# Patient Record
Sex: Female | Born: 1960 | Race: White | Hispanic: No | Marital: Married | Smoking: Never smoker
Health system: Southern US, Community
[De-identification: ages and names within clinical notes are randomized; demographics above are authoritative.]

## PROBLEM LIST (undated history)

## (undated) DIAGNOSIS — G5600 Carpal tunnel syndrome, unspecified upper limb: Secondary | ICD-10-CM

## (undated) HISTORY — PX: WISDOM TOOTH EXTRACTION: SHX21

---

## 1998-04-06 ENCOUNTER — Other Ambulatory Visit: Admission: RE | Admit: 1998-04-06 | Discharge: 1998-04-06 | Payer: Self-pay | Admitting: *Deleted

## 1998-11-06 ENCOUNTER — Other Ambulatory Visit: Admission: RE | Admit: 1998-11-06 | Discharge: 1998-11-06 | Payer: Self-pay | Admitting: Obstetrics & Gynecology

## 1999-01-02 ENCOUNTER — Encounter: Payer: Self-pay | Admitting: *Deleted

## 1999-01-02 ENCOUNTER — Ambulatory Visit (HOSPITAL_COMMUNITY): Admission: RE | Admit: 1999-01-02 | Discharge: 1999-01-02 | Payer: Self-pay | Admitting: *Deleted

## 1999-05-06 ENCOUNTER — Inpatient Hospital Stay (HOSPITAL_COMMUNITY): Admission: AD | Admit: 1999-05-06 | Discharge: 1999-05-06 | Payer: Self-pay | Admitting: *Deleted

## 1999-05-18 ENCOUNTER — Encounter (INDEPENDENT_AMBULATORY_CARE_PROVIDER_SITE_OTHER): Payer: Self-pay | Admitting: Specialist

## 1999-05-18 ENCOUNTER — Inpatient Hospital Stay (HOSPITAL_COMMUNITY): Admission: AD | Admit: 1999-05-18 | Discharge: 1999-05-20 | Payer: Self-pay | Admitting: *Deleted

## 2000-10-28 ENCOUNTER — Other Ambulatory Visit: Admission: RE | Admit: 2000-10-28 | Discharge: 2000-10-28 | Payer: Self-pay | Admitting: Obstetrics and Gynecology

## 2002-05-20 ENCOUNTER — Other Ambulatory Visit: Admission: RE | Admit: 2002-05-20 | Discharge: 2002-05-20 | Payer: Self-pay | Admitting: Obstetrics and Gynecology

## 2003-08-24 ENCOUNTER — Other Ambulatory Visit: Admission: RE | Admit: 2003-08-24 | Discharge: 2003-08-24 | Payer: Self-pay | Admitting: Obstetrics and Gynecology

## 2003-09-14 ENCOUNTER — Ambulatory Visit (HOSPITAL_COMMUNITY): Admission: RE | Admit: 2003-09-14 | Discharge: 2003-09-14 | Payer: Self-pay | Admitting: Obstetrics and Gynecology

## 2004-09-20 ENCOUNTER — Ambulatory Visit (HOSPITAL_COMMUNITY): Admission: RE | Admit: 2004-09-20 | Discharge: 2004-09-20 | Payer: Self-pay | Admitting: Obstetrics and Gynecology

## 2004-10-02 ENCOUNTER — Encounter: Admission: RE | Admit: 2004-10-02 | Discharge: 2004-10-02 | Payer: Self-pay | Admitting: Obstetrics and Gynecology

## 2004-11-12 ENCOUNTER — Other Ambulatory Visit: Admission: RE | Admit: 2004-11-12 | Discharge: 2004-11-12 | Payer: Self-pay | Admitting: Obstetrics and Gynecology

## 2005-10-23 ENCOUNTER — Encounter: Admission: RE | Admit: 2005-10-23 | Discharge: 2005-10-23 | Payer: Self-pay | Admitting: Obstetrics and Gynecology

## 2005-12-15 ENCOUNTER — Other Ambulatory Visit: Admission: RE | Admit: 2005-12-15 | Discharge: 2005-12-15 | Payer: Self-pay | Admitting: Obstetrics and Gynecology

## 2006-10-29 ENCOUNTER — Encounter: Admission: RE | Admit: 2006-10-29 | Discharge: 2006-10-29 | Payer: Self-pay | Admitting: Family Medicine

## 2007-11-01 ENCOUNTER — Encounter: Admission: RE | Admit: 2007-11-01 | Payer: Self-pay | Admitting: Obstetrics and Gynecology

## 2009-04-20 ENCOUNTER — Encounter: Admission: RE | Admit: 2009-04-20 | Discharge: 2009-04-20 | Payer: Self-pay | Admitting: Obstetrics and Gynecology

## 2010-02-24 ENCOUNTER — Encounter: Payer: Self-pay | Admitting: Obstetrics and Gynecology

## 2010-06-21 NOTE — Discharge Summary (Signed)
[facility]  Patient:    Jo Sampson, Jo Sampson                     MRN: [id number] Adm. Date:  [id number] Disc. Date: [id number] Attending:  Shaune Spittle Dictator:   Vance Gather Duplantis, C.N.M.                           Discharge Summary  ADMISSION DIAGNOSES:          1. Intrauterine pregnancy at term.                               2. Active labor.  DISCHARGE DIAGNOSES:          1. Intrauterine pregnancy at term.                               2. Active labor.                               3. Retained placenta, manual removal.                               4. Breast-feeding.                               5. Plan of vasectomy for contraception.  HOSPITAL COURSE:              Jo Sampson is a 50 year old married white female, gravida 3, para 2-0-0-2, at term, who presents in active labor and progressed rapidly to NSVD of a viable female infant named [name].  Had Apgars of 8 and 9 nd weighed 6 pounds 9 ounces, over a midline episiotomy.  She had a retained placenta that required manual removal with nitroglycerin and was successfully removed manually.  Postpartum, she has done well.  She was given three doses of Ancef IV prophylactically and has been afebrile, and her bleeding has been within normal  limits, as has her cramping.  She is breast-feeding without difficulty.  She is  ambulating and voiding, also without difficulty.  She plans vasectomy for contraception.  She is deemed ready for discharge today.  DISCHARGE INSTRUCTIONS:       As per the [facility] handout.  DISCHARGE MEDICATIONS:        1. Motrin 600 mg p.o. q.6h. p.r.n. for pain.                               2. Tylox 1-2 p.o. q.4-6h. p.r.n. for pain.  LABORATORY DATA:              Discharge hemoglobin 9.9, WBC count is 16.1, and er platelets are 219.  FOLLOW-UP:                    Will be in six weeks at [facility] or p.r.n. DD:  05/20/99 TD:   05/20/99 Job: [id number]

## 2010-08-12 ENCOUNTER — Other Ambulatory Visit: Payer: Self-pay | Admitting: Obstetrics and Gynecology

## 2010-08-12 ENCOUNTER — Other Ambulatory Visit (HOSPITAL_COMMUNITY): Payer: Self-pay | Admitting: Obstetrics and Gynecology

## 2010-08-12 DIAGNOSIS — Z1231 Encounter for screening mammogram for malignant neoplasm of breast: Secondary | ICD-10-CM

## 2010-08-20 ENCOUNTER — Ambulatory Visit (HOSPITAL_COMMUNITY): Payer: Self-pay

## 2010-08-22 ENCOUNTER — Ambulatory Visit
Admission: RE | Admit: 2010-08-22 | Discharge: 2010-08-22 | Disposition: A | Discharge status: Home / Self Care | Payer: BC Managed Care – PPO | Source: Ambulatory Visit | Attending: Obstetrics and Gynecology | Admitting: Obstetrics and Gynecology

## 2010-08-22 DIAGNOSIS — Z1231 Encounter for screening mammogram for malignant neoplasm of breast: Secondary | ICD-10-CM

## 2012-04-29 ENCOUNTER — Other Ambulatory Visit: Payer: Self-pay

## 2012-04-29 DIAGNOSIS — Z1231 Encounter for screening mammogram for malignant neoplasm of breast: Secondary | ICD-10-CM

## 2012-05-06 ENCOUNTER — Ambulatory Visit
Admission: RE | Admit: 2012-05-06 | Discharge: 2012-05-06 | Disposition: A | Discharge status: Home / Self Care | Payer: PRIVATE HEALTH INSURANCE | Source: Ambulatory Visit

## 2012-05-06 DIAGNOSIS — Z1231 Encounter for screening mammogram for malignant neoplasm of breast: Secondary | ICD-10-CM

## 2013-10-25 ENCOUNTER — Other Ambulatory Visit: Payer: Self-pay

## 2013-10-25 DIAGNOSIS — Z1231 Encounter for screening mammogram for malignant neoplasm of breast: Secondary | ICD-10-CM

## 2013-11-14 ENCOUNTER — Ambulatory Visit
Admission: RE | Admit: 2013-11-14 | Discharge: 2013-11-14 | Disposition: A | Discharge status: Home / Self Care | Payer: 59 | Source: Ambulatory Visit

## 2013-11-14 DIAGNOSIS — Z1231 Encounter for screening mammogram for malignant neoplasm of breast: Secondary | ICD-10-CM

## 2014-02-28 ENCOUNTER — Other Ambulatory Visit (HOSPITAL_COMMUNITY)
Admission: RE | Admit: 2014-02-28 | Discharge: 2014-02-28 | Disposition: A | Discharge status: Home / Self Care | Payer: 59 | Source: Ambulatory Visit | Attending: Family Medicine | Admitting: Family Medicine

## 2014-02-28 ENCOUNTER — Other Ambulatory Visit: Payer: Self-pay | Admitting: Family Medicine

## 2014-02-28 DIAGNOSIS — Z01419 Encounter for gynecological examination (general) (routine) without abnormal findings: Secondary | ICD-10-CM | POA: Insufficient documentation

## 2014-03-01 LAB — CYTOLOGY - PAP

## 2014-07-06 ENCOUNTER — Other Ambulatory Visit: Payer: Self-pay | Admitting: Orthopedic Surgery

## 2014-07-17 ENCOUNTER — Encounter (HOSPITAL_BASED_OUTPATIENT_CLINIC_OR_DEPARTMENT_OTHER): Payer: Self-pay | Admitting: *Deleted

## 2014-07-18 ENCOUNTER — Ambulatory Visit (HOSPITAL_BASED_OUTPATIENT_CLINIC_OR_DEPARTMENT_OTHER): Payer: 59 | Admitting: Anesthesiology

## 2014-07-18 ENCOUNTER — Encounter (HOSPITAL_BASED_OUTPATIENT_CLINIC_OR_DEPARTMENT_OTHER)
Admission: RE | Disposition: A | Discharge status: Home / Self Care | Payer: Self-pay | Source: Ambulatory Visit | Attending: Orthopedic Surgery

## 2014-07-18 ENCOUNTER — Ambulatory Visit (HOSPITAL_BASED_OUTPATIENT_CLINIC_OR_DEPARTMENT_OTHER)
Admission: RE | Admit: 2014-07-18 | Discharge: 2014-07-18 | Disposition: A | Discharge status: Home / Self Care | Payer: 59 | Source: Ambulatory Visit | Attending: Orthopedic Surgery | Admitting: Orthopedic Surgery

## 2014-07-18 ENCOUNTER — Encounter (HOSPITAL_BASED_OUTPATIENT_CLINIC_OR_DEPARTMENT_OTHER): Payer: Self-pay | Admitting: Orthopedic Surgery

## 2014-07-18 DIAGNOSIS — G5601 Carpal tunnel syndrome, right upper limb: Secondary | ICD-10-CM | POA: Diagnosis not present

## 2014-07-18 DIAGNOSIS — Z79899 Other long term (current) drug therapy: Secondary | ICD-10-CM | POA: Diagnosis not present

## 2014-07-18 DIAGNOSIS — M79641 Pain in right hand: Secondary | ICD-10-CM | POA: Diagnosis present

## 2014-07-18 DIAGNOSIS — R200 Anesthesia of skin: Secondary | ICD-10-CM | POA: Diagnosis present

## 2014-07-18 HISTORY — PX: CARPAL TUNNEL RELEASE: SHX101

## 2014-07-18 HISTORY — DX: Carpal tunnel syndrome, unspecified upper limb: G56.00

## 2014-07-18 LAB — POCT HEMOGLOBIN-HEMACUE: HEMOGLOBIN: 13.5 g/dL (ref 12.0–15.0)

## 2014-07-18 SURGERY — CARPAL TUNNEL RELEASE
Anesthesia: Monitor Anesthesia Care | Site: Hand | Laterality: Right

## 2014-07-18 MED ORDER — CHLORHEXIDINE GLUCONATE 4 % EX LIQD: 60.0000 mL | Freq: Once | CUTANEOUS | Status: DC

## 2014-07-18 MED ORDER — MIDAZOLAM HCL 2 MG/2ML IJ SOLN
INTRAMUSCULAR | Status: AC
  Filled 2014-07-18: qty 2

## 2014-07-18 MED ORDER — CEFAZOLIN SODIUM-DEXTROSE 2-3 GM-% IV SOLR: 2.0000 g | INTRAVENOUS | Status: DC

## 2014-07-18 MED ORDER — MIDAZOLAM HCL 5 MG/5ML IJ SOLN
INTRAMUSCULAR | Status: DC | PRN
  Administered 2014-07-18 (×2): 1 mg via INTRAVENOUS

## 2014-07-18 MED ORDER — FENTANYL CITRATE (PF) 100 MCG/2ML IJ SOLN
INTRAMUSCULAR | Status: AC
Start: 2014-07-18 — End: 2014-07-18
  Filled 2014-07-18: qty 2

## 2014-07-18 MED ORDER — HYDROMORPHONE HCL 1 MG/ML IJ SOLN: 0.2500 mg | INTRAMUSCULAR | Status: DC | PRN

## 2014-07-18 MED ORDER — PROMETHAZINE HCL 25 MG/ML IJ SOLN: 6.2500 mg | INTRAMUSCULAR | Status: DC | PRN

## 2014-07-18 MED ORDER — BUPIVACAINE HCL (PF) 0.25 % IJ SOLN
INTRAMUSCULAR | Status: DC | PRN
  Administered 2014-07-18: 6 mL

## 2014-07-18 MED ORDER — PROPOFOL 500 MG/50ML IV EMUL
INTRAVENOUS | Status: AC
  Filled 2014-07-18: qty 50

## 2014-07-18 MED ORDER — CEFAZOLIN SODIUM-DEXTROSE 2-3 GM-% IV SOLR
2.0000 g | INTRAVENOUS | Status: AC
  Administered 2014-07-18: 2 g via INTRAVENOUS

## 2014-07-18 MED ORDER — FENTANYL CITRATE (PF) 100 MCG/2ML IJ SOLN
INTRAMUSCULAR | Status: DC | PRN
  Administered 2014-07-18 (×2): 50 ug via INTRAVENOUS

## 2014-07-18 MED ORDER — LACTATED RINGERS IV SOLN
INTRAVENOUS | Status: DC
  Administered 2014-07-18: 11:00:00 via INTRAVENOUS

## 2014-07-18 MED ORDER — PROPOFOL 10 MG/ML IV BOLUS
INTRAVENOUS | Status: DC | PRN
  Administered 2014-07-18 (×2): 20 mg via INTRAVENOUS

## 2014-07-18 MED ORDER — CEFAZOLIN SODIUM-DEXTROSE 2-3 GM-% IV SOLR
INTRAVENOUS | Status: AC
  Filled 2014-07-18: qty 50

## 2014-07-18 MED ORDER — HYDROCODONE-ACETAMINOPHEN 5-325 MG PO TABS: 1.0000 | ORAL_TABLET | Freq: Four times a day (QID) | ORAL | Status: DC | PRN

## 2014-07-18 MED ORDER — LIDOCAINE HCL (CARDIAC) 20 MG/ML IV SOLN
INTRAVENOUS | Status: DC | PRN
  Administered 2014-07-18: 35 mg via INTRAVENOUS

## 2014-07-18 MED ORDER — SODIUM CHLORIDE 0.9 % IR SOLN
Status: DC | PRN
  Administered 2014-07-18: 200 mL

## 2014-07-18 SURGICAL SUPPLY — 40 items
BLADE SURG 15 STRL LF DISP TIS (BLADE) ×1 IMPLANT
BLADE SURG 15 STRL SS (BLADE) ×3
BNDG CMPR 9X4 STRL LF SNTH (GAUZE/BANDAGES/DRESSINGS)
BNDG COHESIVE 3X5 TAN STRL LF (GAUZE/BANDAGES/DRESSINGS) ×3 IMPLANT
BNDG ESMARK 4X9 LF (GAUZE/BANDAGES/DRESSINGS) IMPLANT
BNDG GAUZE ELAST 4 BULKY (GAUZE/BANDAGES/DRESSINGS) ×3 IMPLANT
CHLORAPREP W/TINT 26ML (MISCELLANEOUS) ×3 IMPLANT
CORDS BIPOLAR (ELECTRODE) ×3 IMPLANT
COVER BACK TABLE 60X90IN (DRAPES) ×3 IMPLANT
COVER MAYO STAND STRL (DRAPES) ×3 IMPLANT
CUFF TOURNIQUET SINGLE 18IN (TOURNIQUET CUFF) ×3 IMPLANT
DRAPE EXTREMITY T 121X128X90 (DRAPE) ×3 IMPLANT
DRAPE SURG 17X23 STRL (DRAPES) ×3 IMPLANT
DRSG PAD ABDOMINAL 8X10 ST (GAUZE/BANDAGES/DRESSINGS) ×3 IMPLANT
GAUZE SPONGE 4X4 12PLY STRL (GAUZE/BANDAGES/DRESSINGS) ×3 IMPLANT
GAUZE XEROFORM 1X8 LF (GAUZE/BANDAGES/DRESSINGS) ×3 IMPLANT
GLOVE BIOGEL PI IND STRL 6.5 (GLOVE) IMPLANT
GLOVE BIOGEL PI IND STRL 7.5 (GLOVE) IMPLANT
GLOVE BIOGEL PI IND STRL 8.5 (GLOVE) ×1 IMPLANT
GLOVE BIOGEL PI INDICATOR 6.5 (GLOVE) ×2
GLOVE BIOGEL PI INDICATOR 7.5 (GLOVE) ×4
GLOVE BIOGEL PI INDICATOR 8.5 (GLOVE) ×2
GLOVE ECLIPSE 7.0 STRL STRAW (GLOVE) ×2 IMPLANT
GLOVE EXAM NITRILE LRG STRL (GLOVE) ×2 IMPLANT
GLOVE SURG ORTHO 8.0 STRL STRW (GLOVE) ×3 IMPLANT
GLOVE SURG SS PI 6.5 STRL IVOR (GLOVE) ×2 IMPLANT
GOWN STRL REUS W/ TWL LRG LVL3 (GOWN DISPOSABLE) ×1 IMPLANT
GOWN STRL REUS W/TWL LRG LVL3 (GOWN DISPOSABLE) ×3
GOWN STRL REUS W/TWL XL LVL3 (GOWN DISPOSABLE) ×3 IMPLANT
NDL PRECISIONGLIDE 27X1.5 (NEEDLE) IMPLANT
NEEDLE PRECISIONGLIDE 27X1.5 (NEEDLE) IMPLANT
NS IRRIG 1000ML POUR BTL (IV SOLUTION) ×3 IMPLANT
PACK BASIN DAY SURGERY FS (CUSTOM PROCEDURE TRAY) ×3 IMPLANT
STOCKINETTE 4X48 STRL (DRAPES) ×3 IMPLANT
SUT ETHILON 4 0 PS 2 18 (SUTURE) ×3 IMPLANT
SUT VICRYL 4-0 PS2 18IN ABS (SUTURE) IMPLANT
SYR BULB 3OZ (MISCELLANEOUS) ×3 IMPLANT
SYR CONTROL 10ML LL (SYRINGE) IMPLANT
TOWEL OR 17X24 6PK STRL BLUE (TOWEL DISPOSABLE) ×3 IMPLANT
UNDERPAD 30X30 (UNDERPADS AND DIAPERS) ×3 IMPLANT

## 2014-07-18 NOTE — Anesthesia Postprocedure Evaluation (Signed)
  Anesthesia Post-op Note  Patient: Jo Sampson  Procedure(s) Performed: Procedure(s): RIGHT CARPAL TUNNEL RELEASE (Right)  Patient Location: PACU  Anesthesia Type:Regional  Level of Consciousness: awake and alert   Airway and Oxygen Therapy: Patient Spontanous Breathing  Post-op Pain: none  Post-op Assessment: Post-op Vital signs reviewed              Post-op Vital Signs: stable  Last Vitals:  Filed Vitals:   07/18/14 1242  BP: 116/64  Pulse: 66  Temp: 36.8 C  Resp: 16    Complications: No apparent anesthesia complications

## 2014-07-18 NOTE — H&P (Signed)
  Jo Sampson is a 54 year old right hand dominant female who comes in complaining of hand numbness, tingling and pain right side. This awakens her 7 out of 7 nights. She has had symptoms for several years which improved and then began again in January of 2016. She has been wearing a night splint with no relief. She has no history of injury to the hand or neck. She is awakened 7 out of 7 nights. There is no history of diabetes, thyroid problems, arthritis or gout. She states that doing her hair blowing drying it and writing frequently increases symptoms for her. She states the numbness and tingling is relatively constant. She is not complaining of symptoms on her left side. Nerve conductions revael carpal tunne syndrome.  PAST MEDICAL HISTORY:  She has no drug allergies. She is on Melatonin. She has had no surgery.   FAMILY MEDICAL HISTORY: Positive for diabetes and she has been tested, high BP.   SOCIAL HISTORY:  She does not smoke. She drinks socially. She is married and a housewife.  REVIEW OF SYSTEMS: Negative for 14 points.  Jo Sampson is an 54 y.o. female.   Chief Complaint: right carpal tunnel HPI: see above  Past Medical History  Diagnosis Date  . CTS (carpal tunnel syndrome)     right    Past Surgical History  Procedure Laterality Date  . Wisdom tooth extraction      History reviewed. No pertinent family history. Social History:  reports that she has never smoked. She does not have any smokeless tobacco history on file. She reports that she drinks alcohol. She reports that she does not use illicit drugs.  Allergies: No Known Allergies  No prescriptions prior to admission    No results found for this or any previous visit (from the past 48 hour(s)).  No results found.   Pertinent items are noted in HPI.  Height 5\' 4"  (1.626 m), weight 57.607 kg (127 lb), last menstrual period 07/10/2014.  General appearance: alert, cooperative and appears stated age Head:  Normocephalic, without obvious abnormality Neck: no JVD Resp: clear to auscultation bilaterally Cardio: regular rate and rhythm, S1, S2 normal, no murmur, click, rub or gallop GI: soft, non-tender; bowel sounds normal; no masses,  no organomegaly Extremities: numbness right hand Pulses: 2+ and symmetric Skin: Skin color, texture, turgor normal. No rashes or lesions Neurologic: Grossly normal Incision/Wound: na  Assessment/Plan X-rays of her hand are essentially negative.  DIAGNOSIS:  carpal tunnel syndrome right hand. PLAN: We have discussed the risks and complications.  She is aware there is no guarantee with the surgery, the possibility of infection, recurrence, injury to arteries, nerves, tendons, incomplete relief of symptoms and dystrophy.    She isscheduled for release of right carpal tunnel as an outpatient under regional anesthesia.  Jo Sampson 07/18/2014, 10:08 AM

## 2014-07-18 NOTE — Discharge Instructions (Addendum)

## 2014-07-18 NOTE — Brief Op Note (Signed)
07/18/2014  12:07 PM  PATIENT:  Jo Sampson  54 y.o. female  PRE-OPERATIVE DIAGNOSIS:  right carpal tunnel syndrome  POST-OPERATIVE DIAGNOSIS:  right carpal tunnel syndrome  PROCEDURE:  Procedure(s): RIGHT CARPAL TUNNEL RELEASE (Right)  SURGEON:  Surgeon(s) and Role:    * Cindee Salt, MD - Primary  PHYSICIAN ASSISTANT:   ASSISTANTS: none   ANESTHESIA:   local and regional  EBL:  Total I/O In: 500 [I.V.:500] Out: -   BLOOD ADMINISTERED:none  DRAINS: none   LOCAL MEDICATIONS USED:  BUPIVICAINE   SPECIMEN:  No Specimen  DISPOSITION OF SPECIMEN:  N/A  COUNTS:  YES  TOURNIQUET:   Total Tourniquet Time Documented: area (Right) - 19 minutes Total: area (Right) - 19 minutes   DICTATION: .Other Dictation: Dictation Number [id number]  PLAN OF CARE: Discharge to home after PACU  PATIENT DISPOSITION:  PACU - hemodynamically stable.

## 2014-07-18 NOTE — Transfer of Care (Signed)
Immediate Anesthesia Transfer of Care Note  Patient: Jo Sampson  Procedure(s) Performed: Procedure(s): RIGHT CARPAL TUNNEL RELEASE (Right)  Patient Location: PACU  Anesthesia Type:MAC and Bier block  Level of Consciousness: awake, alert  and oriented  Airway & Oxygen Therapy: Patient Spontanous Breathing and Patient connected to face mask oxygen  Post-op Assessment: Report given to RN and Post -op Vital signs reviewed and stable  Post vital signs: Reviewed and stable  Last Vitals:  Filed Vitals:   07/18/14 1105  BP: 115/67  Pulse: 65  Temp: 36.7 C  Resp: 18    Complications: No apparent anesthesia complications

## 2014-07-18 NOTE — Op Note (Signed)
Dictation Number [id number]

## 2014-07-18 NOTE — Anesthesia Procedure Notes (Signed)
Procedure Name: MAC Date/Time: 07/18/2014 11:44 AM Performed by: Gar Gibbon Pre-anesthesia Checklist: Patient identified, Timeout performed, Emergency Drugs available, Suction available and Patient being monitored

## 2014-07-18 NOTE — Anesthesia Preprocedure Evaluation (Addendum)
Anesthesia Evaluation  Patient identified by MRN, date of birth, ID band Patient awake    Reviewed: Allergy & Precautions, NPO status , Patient's Chart, lab work & pertinent test results  History of Anesthesia Complications Negative for: history of anesthetic complications  Airway Mallampati: I  TM Distance: >3 FB Neck ROM: Full    Dental   Pulmonary neg pulmonary ROS,  breath sounds clear to auscultation        Cardiovascular negative cardio ROS  Rhythm:Regular Rate:Normal     Neuro/Psych    GI/Hepatic negative GI ROS, Neg liver ROS,   Endo/Other  negative endocrine ROS  Renal/GU negative Renal ROS     Musculoskeletal negative musculoskeletal ROS (+)   Abdominal   Peds  Hematology negative hematology ROS (+)   Anesthesia Other Findings   Reproductive/Obstetrics                            Anesthesia Physical Anesthesia Plan  ASA: I  Anesthesia Plan: MAC and Regional   Post-op Pain Management:    Induction: Intravenous  Airway Management Planned: Natural Airway and Simple Face Mask  Additional Equipment:   Intra-op Plan:   Post-operative Plan:   Informed Consent: I have reviewed the patients History and Physical, chart, labs and discussed the procedure including the risks, benefits and alternatives for the proposed anesthesia with the patient or authorized representative who has indicated his/her understanding and acceptance.   Dental advisory given  Plan Discussed with: CRNA and Surgeon  Anesthesia Plan Comments:         Anesthesia Quick Evaluation

## 2014-07-19 ENCOUNTER — Encounter (HOSPITAL_BASED_OUTPATIENT_CLINIC_OR_DEPARTMENT_OTHER): Payer: Self-pay | Admitting: Orthopedic Surgery

## 2014-07-19 NOTE — Op Note (Signed)
NAMEMarland Kitchen  LEGACEE, CREEKMORE NO.:  [id number]  MEDICAL RECORD NO.:  [id number]  LOCATION:                               FACILITY:  [facility]  PHYSICIAN:  Cindee Salt, M.D.       DATE OF BIRTH:  01-03-1961  DATE OF PROCEDURE:  07/18/2014 DATE OF DISCHARGE:  07/18/2014                              OPERATIVE REPORT   PREOPERATIVE DIAGNOSIS:  Carpal tunnel syndrome, right hand.  POSTOPERATIVE DIAGNOSIS:  Carpal tunnel syndrome, right hand.  OPERATION:  Decompression of right median nerve.  SURGEON:  Cindee Salt, MD  ANESTHESIA:  Forearm-based IV regional with local infiltration.  ANESTHESIOLOGIST:  Burna Forts, MD  HISTORY:  The patient is a 54 year old female with a history of carpal tunnel syndrome, nerve conduction is positive, nonresponsive to conservative treatment.  She has elected to undergo surgical release on the median nerve, right wrist.  Pre, peri, and postoperative course have been discussed along with risks and complications.  She is aware there is no guarantee with the surgery; possibility of infection; recurrence of injury to arteries, nerves, tendons; incomplete relief of symptoms; dystrophy.  In the preoperative area, the patient was seen, the extremity marked by both patient and surgeon.  Antibiotic given.  PROCEDURE IN DETAIL:  The patient was brought to the operating room, where forearm-based IV regional anesthetic was carried out without difficulty.  She was prepped using ChloraPrep, supine position, right arm free.  A 3-minute dry time was allowed.  Time-out taken, confirming patient and procedure.  A longitudinal incision was made in the right palm, carried down through subcutaneous tissue.  Bleeders were electrocauterized with bipolar.  Dissection was carried down to the palmar fascia.  This was split bluntly.  A retractor placed.  The flexor tendon of the ring and little finger identified.  After identification of the palmar arch,  retractors were placed sweeping the median nerve radially, the ulnar nerve was elevated.  The flexor retinaculum was incised with sharp dissection.  Right angle Sewall retractor was placed between skin and forearm fascia.  The fascia released for approximately 2 cm proximal to the wrist crease under direct vision.  Canal was explored.  Air compression to the nerve was apparent.  Motor branch entered into muscle.  No further lesions were identified and the wound was copiously irrigated with saline and the skin was closed with interrupted 4-0 nylon sutures.  Local infiltration with 0.25% bupivacaine without epinephrine was given, approximately 6 mL was used. Sterile compressive dressing with the fingers free.  On deflation of the tourniquet, all fingers immediately pinked.  She was taken to the recovery room for observation in satisfactory condition.  She will be discharged home to return to [facility] [place] in 1 week on Norco.    ______________________________ Cindee Salt, M.D.   ______________________________ Cindee Salt, M.D.    GK/MEDQ  D:  07/18/2014  T:  07/19/2014  Job:  [id number]

## 2014-10-30 ENCOUNTER — Other Ambulatory Visit: Payer: Self-pay

## 2014-10-30 DIAGNOSIS — Z1231 Encounter for screening mammogram for malignant neoplasm of breast: Secondary | ICD-10-CM

## 2014-11-17 ENCOUNTER — Ambulatory Visit
Admission: RE | Admit: 2014-11-17 | Discharge: 2014-11-17 | Disposition: A | Discharge status: Home / Self Care | Payer: 59 | Source: Ambulatory Visit

## 2014-11-17 DIAGNOSIS — Z1231 Encounter for screening mammogram for malignant neoplasm of breast: Secondary | ICD-10-CM

## 2015-12-14 ENCOUNTER — Other Ambulatory Visit: Payer: Self-pay | Admitting: Family Medicine

## 2015-12-14 DIAGNOSIS — Z1231 Encounter for screening mammogram for malignant neoplasm of breast: Secondary | ICD-10-CM

## 2016-01-08 ENCOUNTER — Ambulatory Visit
Admission: RE | Admit: 2016-01-08 | Discharge: 2016-01-08 | Disposition: A | Discharge status: Home / Self Care | Payer: 59 | Source: Ambulatory Visit | Attending: Family Medicine | Admitting: Family Medicine

## 2016-01-08 DIAGNOSIS — Z1231 Encounter for screening mammogram for malignant neoplasm of breast: Secondary | ICD-10-CM

## 2017-11-27 ENCOUNTER — Other Ambulatory Visit: Payer: Self-pay | Admitting: Family Medicine

## 2017-11-27 DIAGNOSIS — Z1231 Encounter for screening mammogram for malignant neoplasm of breast: Secondary | ICD-10-CM

## 2018-01-07 ENCOUNTER — Ambulatory Visit
Admission: RE | Admit: 2018-01-07 | Discharge: 2018-01-07 | Disposition: A | Discharge status: Home / Self Care | Payer: 59 | Source: Ambulatory Visit | Attending: Family Medicine | Admitting: Family Medicine

## 2018-01-07 DIAGNOSIS — Z1231 Encounter for screening mammogram for malignant neoplasm of breast: Secondary | ICD-10-CM

## 2019-03-21 ENCOUNTER — Other Ambulatory Visit: Payer: Self-pay | Admitting: Family Medicine

## 2019-03-21 DIAGNOSIS — Z1231 Encounter for screening mammogram for malignant neoplasm of breast: Secondary | ICD-10-CM

## 2019-04-20 ENCOUNTER — Other Ambulatory Visit (HOSPITAL_COMMUNITY)
Admission: RE | Admit: 2019-04-20 | Discharge: 2019-04-20 | Disposition: A | Discharge status: Home / Self Care | Payer: 59 | Source: Ambulatory Visit | Attending: Family Medicine | Admitting: Family Medicine

## 2019-04-20 ENCOUNTER — Other Ambulatory Visit: Payer: Self-pay | Admitting: Family Medicine

## 2019-04-20 DIAGNOSIS — Z0000 Encounter for general adult medical examination without abnormal findings: Secondary | ICD-10-CM | POA: Insufficient documentation

## 2019-04-25 ENCOUNTER — Other Ambulatory Visit: Payer: Self-pay

## 2019-04-25 ENCOUNTER — Ambulatory Visit
Admission: RE | Admit: 2019-04-25 | Discharge: 2019-04-25 | Disposition: A | Discharge status: Home / Self Care | Payer: 59 | Source: Ambulatory Visit | Attending: Family Medicine | Admitting: Family Medicine

## 2019-04-25 DIAGNOSIS — Z1231 Encounter for screening mammogram for malignant neoplasm of breast: Secondary | ICD-10-CM

## 2019-05-02 LAB — CYTOLOGY - PAP
Comment: NEGATIVE
Diagnosis: NEGATIVE
Diagnosis: REACTIVE
High risk HPV: NEGATIVE

## 2020-06-25 ENCOUNTER — Other Ambulatory Visit: Payer: Self-pay | Admitting: Family Medicine

## 2020-06-25 DIAGNOSIS — Z1231 Encounter for screening mammogram for malignant neoplasm of breast: Secondary | ICD-10-CM

## 2020-06-27 ENCOUNTER — Ambulatory Visit: Payer: 59

## 2020-06-27 ENCOUNTER — Other Ambulatory Visit: Payer: Self-pay

## 2020-06-27 ENCOUNTER — Ambulatory Visit
Admission: RE | Admit: 2020-06-27 | Discharge: 2020-06-27 | Disposition: A | Discharge status: Home / Self Care | Payer: 59 | Source: Ambulatory Visit | Attending: Family Medicine | Admitting: Family Medicine

## 2020-06-27 DIAGNOSIS — Z1231 Encounter for screening mammogram for malignant neoplasm of breast: Secondary | ICD-10-CM

## 2021-10-28 ENCOUNTER — Other Ambulatory Visit: Payer: Self-pay | Admitting: Family Medicine

## 2021-10-28 DIAGNOSIS — Z1231 Encounter for screening mammogram for malignant neoplasm of breast: Secondary | ICD-10-CM

## 2021-10-31 ENCOUNTER — Ambulatory Visit
Admission: RE | Admit: 2021-10-31 | Discharge: 2021-10-31 | Disposition: A | Discharge status: Home / Self Care | Payer: 59 | Source: Ambulatory Visit | Attending: Family Medicine | Admitting: Family Medicine

## 2021-10-31 DIAGNOSIS — Z1231 Encounter for screening mammogram for malignant neoplasm of breast: Secondary | ICD-10-CM

## 2022-07-24 IMAGING — MG MM DIGITAL SCREENING BILAT W/ TOMO AND CAD
8 series · 9 of 24 positions shown · non-contrast
Comparison: Previous exam(s).

CLINICAL DATA: Screening.

EXAM:
DIGITAL SCREENING BILATERAL MAMMOGRAM WITH TOMOSYNTHESIS AND CAD
TECHNIQUE: Bilateral screening digital craniocaudal and mediolateral oblique
mammograms were obtained. Bilateral screening digital breast
tomosynthesis was performed. The images were evaluated with
computer-aided detection.

[L MLO synth-2D]
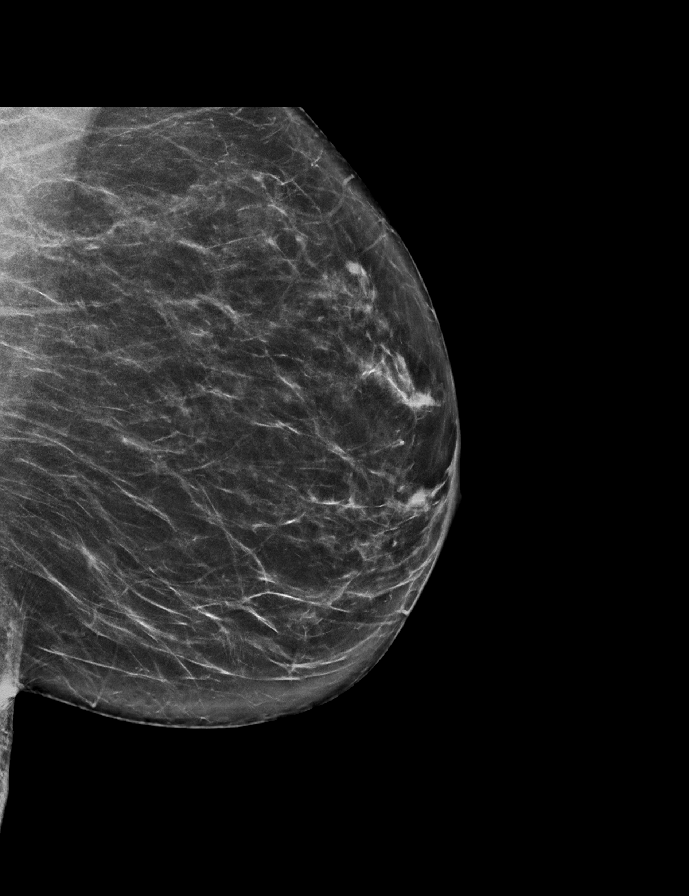

[R CC synth-2D]
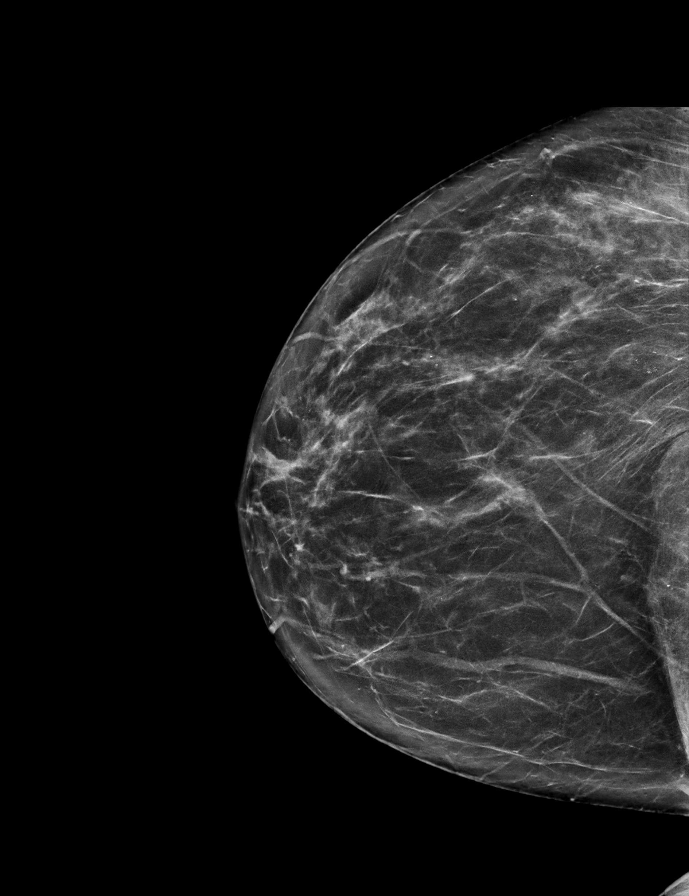

[L CC synth-2D]
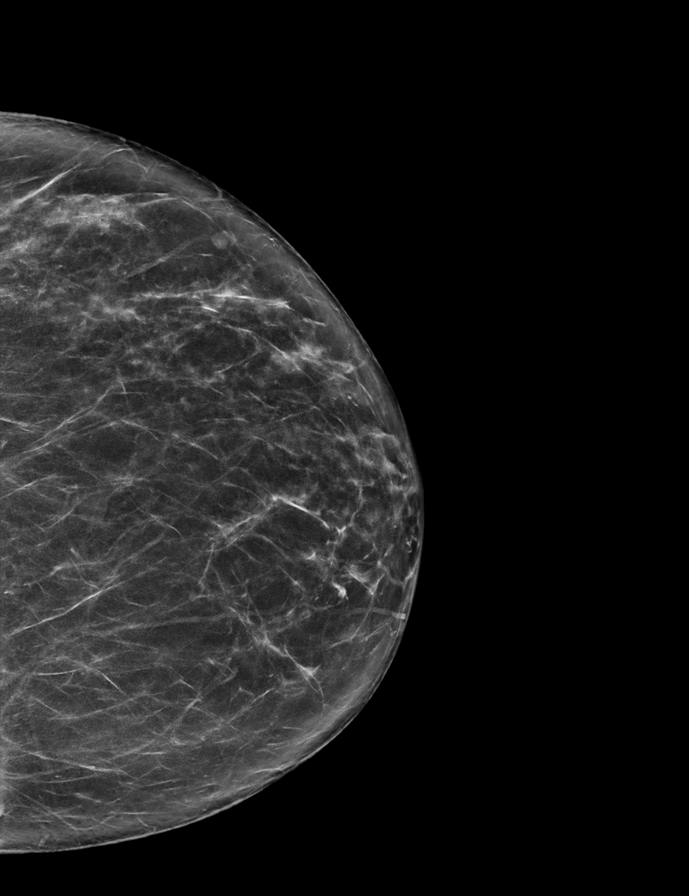

[R MLO synth-2D]
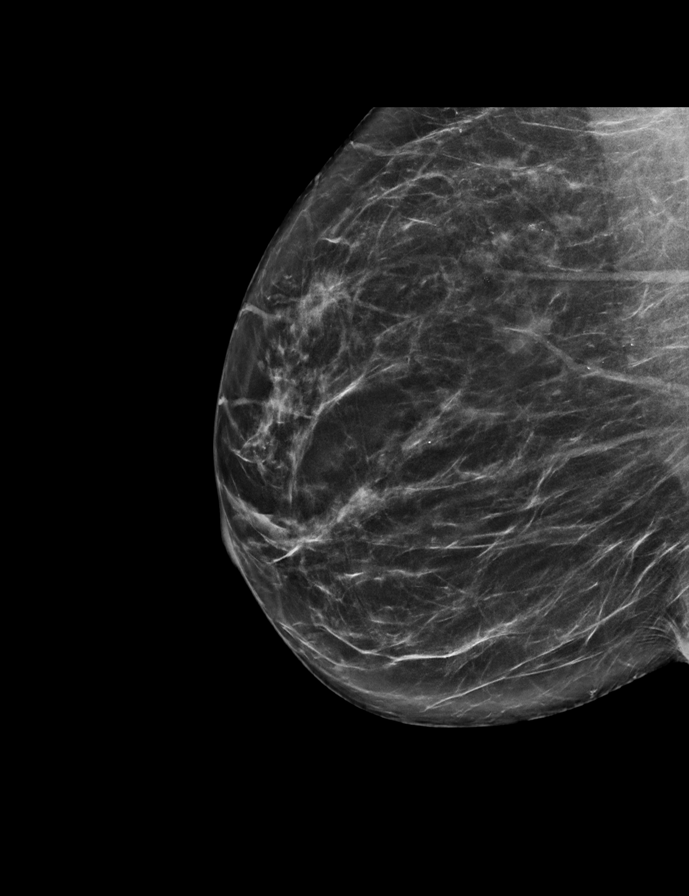

[L CC tomo · 2 of 62 frames shown]
[frame 21/62]
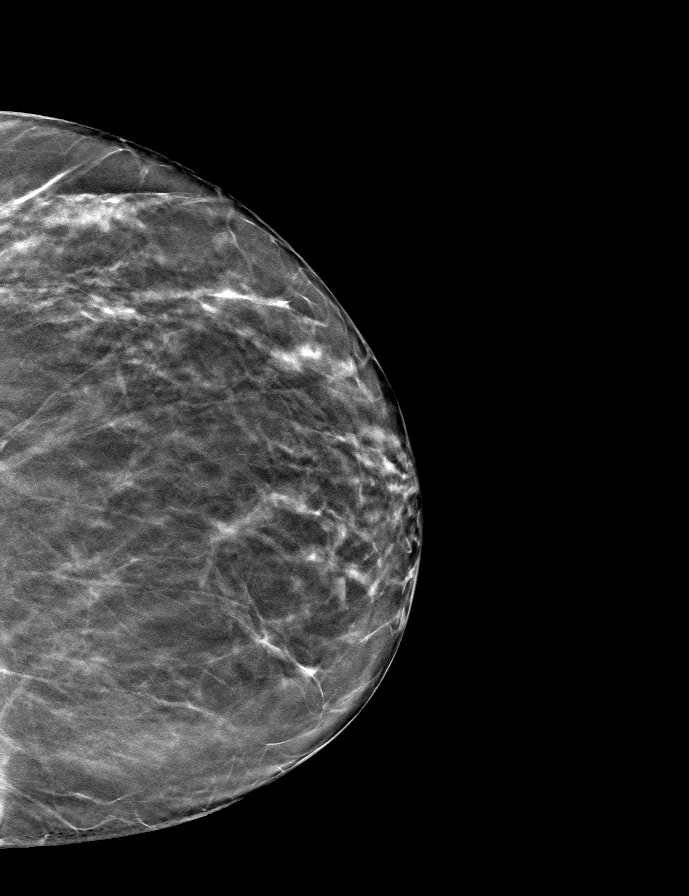
[frame 31/62]
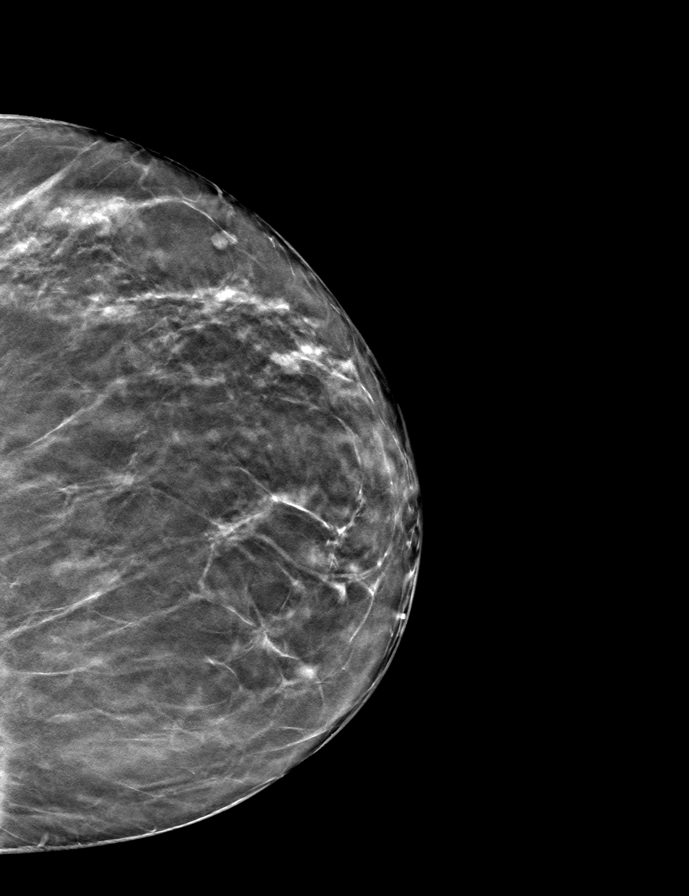

[L MLO tomo · tomo slice 35/70.0]
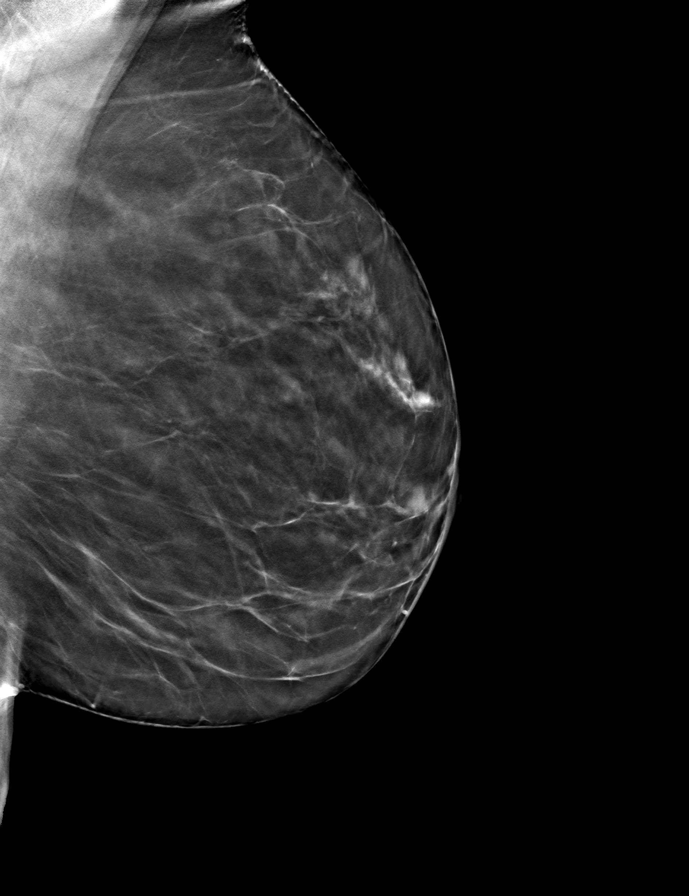

[R MLO tomo · tomo slice 35/68.0]
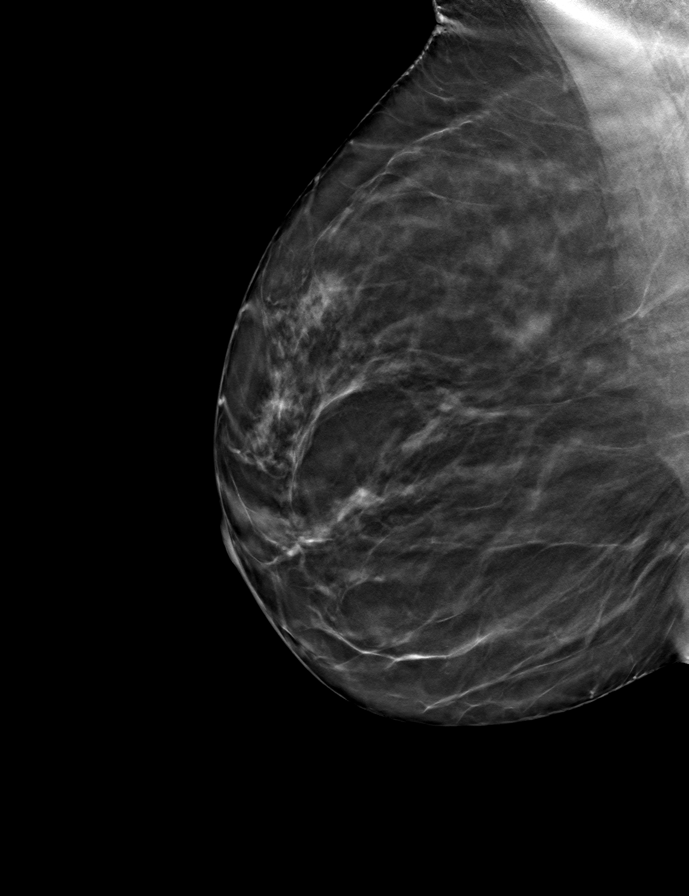

[R CC tomo · tomo slice 33/65.0]
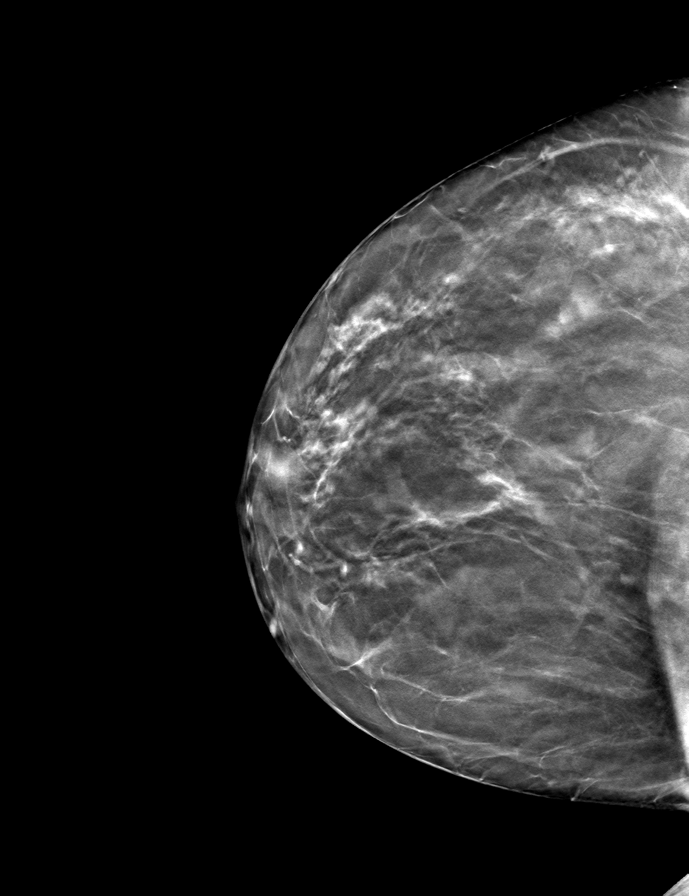

[9 of 24 positions shown; findings below may reference images not displayed]

ACR Breast Density Category b: There are scattered areas of
fibroglandular density.
FINDINGS: There are no findings suspicious for malignancy. The images were
evaluated with computer-aided detection.
IMPRESSION: No mammographic evidence of malignancy. A result letter of this
screening mammogram will be mailed directly to the patient.

RECOMMENDATION:
Screening mammogram in one year. (Code:WJ-I-BG6)

BI-RADS CATEGORY  1: Negative.

## 2022-11-27 LAB — HM DEXA SCAN

## 2022-12-16 ENCOUNTER — Other Ambulatory Visit: Payer: Self-pay | Admitting: Family Medicine

## 2022-12-16 DIAGNOSIS — Z0000 Encounter for general adult medical examination without abnormal findings: Secondary | ICD-10-CM

## 2022-12-16 NOTE — Progress Notes (Unsigned)
   Jo Payor, PhD, LAT, ATC acting as a scribe for Jo Graham, MD.  Jo Sampson is a 62 y.o. female who presents to [facility] today for osteoporosis management.  DEXA scan (date, T-score): 11/27/22: Spine= -2.5, L-FN= -0.5, R-FN= -1.0 Prior treatment: none History of Hip, Spine, or Wrist Fx: no Heart disease or stroke: no Cancer: no hx Kidney Disease: no Gastric/Peptic Ulcer: no Gastric bypass surgery: no Severe GERD: no Hx of seizures: no Age at Menopause: ~50 Calcium intake: yes-  Vitamin D intake: yes Hormone replacement therapy: none Smoking history: never  Alcohol: occasionally Exercise: yes- 5x/wk Major dental work in past year: no Parents with hip/spine fracture: no Height loss: no   Pertinent review of systems: No fevers or chills  Relevant historical information: Otherwise healthy Patient participates in daily weightbearing exercise including walking and resistance training.  She does take some vitamin D intermittently but not every day.   Exam:  BP 116/74   Pulse 66   Ht 5\' 4"  (1.626 m)   Wt 127 lb 12.8 oz (58 kg)   LMP 07/10/2014   SpO2 99%   BMI 21.94 kg/m  General: Well Developed, well nourished, and in no acute distress.   MSK: Normal coordination and gait.      Lab and Radiology Results  T-score: L-spine -2.5 recently.    Assessment and Plan: 62 y.o. female with osteoporosis. [name] has pretty well optimized her conservative management.  She is doing daily weightbearing exercise including resistance training.  She is taking calcium and vitamin D.  However were not really sure what her vitamin D level is in I will check vitamin D as she has had deficiency in the past to make sure her level is correct.  She is reluctant to take prescription medication for osteoporosis.  I think rechecking her DEXA scan in 1-2 years is reasonable and if worsening starting medication such as Fosamax or Prolia is a good  plan.   PDMP not reviewed this encounter. Orders Placed This Encounter  Procedures   VITAMIN D 25 Hydroxy (Vit-D Deficiency, Fractures)    Standing Status:   Future    Number of Occurrences:   1    Standing Expiration Date:   06/17/2023   No orders of the defined types were placed in this encounter.    Discussed warning signs or symptoms. Please see discharge instructions. Patient expresses understanding.   The above documentation has been reviewed and is accurate and complete Jo Sampson, M.D. Total encounter time 30 minutes including face-to-face time with the patient and, reviewing past medical record, and charting on the date of service.

## 2022-12-18 ENCOUNTER — Encounter: Payer: Self-pay | Admitting: Family Medicine

## 2022-12-18 ENCOUNTER — Ambulatory Visit (INDEPENDENT_AMBULATORY_CARE_PROVIDER_SITE_OTHER): Payer: 59 | Admitting: Family Medicine

## 2022-12-18 VITALS — BP 116/74 | HR 66 | Ht 64.0 in | Wt 127.8 lb

## 2022-12-18 DIAGNOSIS — E559 Vitamin D deficiency, unspecified: Secondary | ICD-10-CM

## 2022-12-18 DIAGNOSIS — M810 Age-related osteoporosis without current pathological fracture: Secondary | ICD-10-CM

## 2022-12-18 LAB — VITAMIN D 25 HYDROXY (VIT D DEFICIENCY, FRACTURES): VITD: 35.02 ng/mL (ref 30.00–100.00)

## 2022-12-18 NOTE — Patient Instructions (Signed)
Thank you for coming in today.   Please get labs today before you leave   Continue weight bearing exercises, and Vit D.  Ok to continue calcium unless you have a kidney stone.   You should have your bone density rechecked in 2 years.   Let me know how you do.   I am here for you if you need me for acute injury.

## 2022-12-22 NOTE — Progress Notes (Signed)
Vitamin D level is 35.  Please continue current level of vitamin D supplementation.

## 2023-01-12 ENCOUNTER — Ambulatory Visit
Admission: RE | Admit: 2023-01-12 | Discharge: 2023-01-12 | Disposition: A | Discharge status: Home / Self Care | Payer: 59 | Source: Ambulatory Visit | Attending: Family Medicine | Admitting: Family Medicine

## 2023-01-12 DIAGNOSIS — Z0000 Encounter for general adult medical examination without abnormal findings: Secondary | ICD-10-CM

## 2023-03-19 ENCOUNTER — Other Ambulatory Visit: Payer: Self-pay | Admitting: Medical Genetics

## 2023-03-19 DIAGNOSIS — Z006 Encounter for examination for normal comparison and control in clinical research program: Secondary | ICD-10-CM

## 2023-04-01 LAB — GENECONNECT MOLECULAR SCREEN: Genetic Analysis Overall Interpretation: NEGATIVE

## 2023-08-03 DIAGNOSIS — M810 Age-related osteoporosis without current pathological fracture: Secondary | ICD-10-CM | POA: Diagnosis not present

## 2023-08-03 DIAGNOSIS — Z13220 Encounter for screening for lipoid disorders: Secondary | ICD-10-CM | POA: Diagnosis not present

## 2023-08-03 DIAGNOSIS — G4700 Insomnia, unspecified: Secondary | ICD-10-CM | POA: Diagnosis not present

## 2023-08-03 DIAGNOSIS — Z0000 Encounter for general adult medical examination without abnormal findings: Secondary | ICD-10-CM | POA: Diagnosis not present

## 2023-08-03 DIAGNOSIS — B001 Herpesviral vesicular dermatitis: Secondary | ICD-10-CM | POA: Diagnosis not present

## 2023-08-18 DIAGNOSIS — R5382 Chronic fatigue, unspecified: Secondary | ICD-10-CM | POA: Diagnosis not present

## 2023-08-18 DIAGNOSIS — R635 Abnormal weight gain: Secondary | ICD-10-CM | POA: Diagnosis not present

## 2023-08-18 DIAGNOSIS — F5109 Other insomnia not due to a substance or known physiological condition: Secondary | ICD-10-CM | POA: Diagnosis not present

## 2023-08-18 DIAGNOSIS — E7800 Pure hypercholesterolemia, unspecified: Secondary | ICD-10-CM | POA: Diagnosis not present

## 2023-08-18 DIAGNOSIS — D519 Vitamin B12 deficiency anemia, unspecified: Secondary | ICD-10-CM | POA: Diagnosis not present

## 2023-08-18 DIAGNOSIS — E289 Ovarian dysfunction, unspecified: Secondary | ICD-10-CM | POA: Diagnosis not present

## 2023-08-18 DIAGNOSIS — E559 Vitamin D deficiency, unspecified: Secondary | ICD-10-CM | POA: Diagnosis not present

## 2023-12-11 ENCOUNTER — Other Ambulatory Visit: Payer: Self-pay | Admitting: Family Medicine

## 2023-12-11 DIAGNOSIS — Z1231 Encounter for screening mammogram for malignant neoplasm of breast: Secondary | ICD-10-CM

## 2024-01-04 DIAGNOSIS — Z01419 Encounter for gynecological examination (general) (routine) without abnormal findings: Secondary | ICD-10-CM | POA: Diagnosis not present

## 2024-01-04 DIAGNOSIS — Z6822 Body mass index (BMI) 22.0-22.9, adult: Secondary | ICD-10-CM | POA: Diagnosis not present

## 2024-01-13 ENCOUNTER — Ambulatory Visit
Admission: RE | Admit: 2024-01-13 | Discharge: 2024-01-13 | Disposition: A | Discharge status: Home / Self Care | Source: Ambulatory Visit | Attending: Family Medicine | Admitting: Family Medicine

## 2024-01-13 DIAGNOSIS — Z1231 Encounter for screening mammogram for malignant neoplasm of breast: Secondary | ICD-10-CM
